# Patient Record
Sex: Male | Born: 1955 | Race: White | Hispanic: No | Marital: Married | Smoking: Never smoker
Health system: Southern US, Community
[De-identification: ages and names within clinical notes are randomized; demographics above are authoritative.]

## PROBLEM LIST (undated history)

## (undated) DIAGNOSIS — O2230 Deep phlebothrombosis in pregnancy, unspecified trimester: Secondary | ICD-10-CM

## (undated) DIAGNOSIS — I4891 Unspecified atrial fibrillation: Secondary | ICD-10-CM

## (undated) DIAGNOSIS — I2510 Atherosclerotic heart disease of native coronary artery without angina pectoris: Secondary | ICD-10-CM

## (undated) DIAGNOSIS — I10 Essential (primary) hypertension: Secondary | ICD-10-CM

## (undated) DIAGNOSIS — E079 Disorder of thyroid, unspecified: Secondary | ICD-10-CM

---

## 2017-02-02 ENCOUNTER — Emergency Department (HOSPITAL_BASED_OUTPATIENT_CLINIC_OR_DEPARTMENT_OTHER): Payer: BLUE CROSS/BLUE SHIELD

## 2017-02-02 ENCOUNTER — Emergency Department (HOSPITAL_BASED_OUTPATIENT_CLINIC_OR_DEPARTMENT_OTHER)
Admission: EM | Admit: 2017-02-02 | Discharge: 2017-02-02 | Disposition: A | Discharge status: Home / Self Care | Payer: BLUE CROSS/BLUE SHIELD | Attending: Emergency Medicine | Admitting: Emergency Medicine

## 2017-02-02 ENCOUNTER — Encounter (HOSPITAL_BASED_OUTPATIENT_CLINIC_OR_DEPARTMENT_OTHER): Payer: Self-pay | Admitting: Emergency Medicine

## 2017-02-02 DIAGNOSIS — Z79899 Other long term (current) drug therapy: Secondary | ICD-10-CM | POA: Diagnosis not present

## 2017-02-02 DIAGNOSIS — M25561 Pain in right knee: Secondary | ICD-10-CM

## 2017-02-02 DIAGNOSIS — M25462 Effusion, left knee: Secondary | ICD-10-CM | POA: Diagnosis not present

## 2017-02-02 DIAGNOSIS — M25562 Pain in left knee: Secondary | ICD-10-CM | POA: Insufficient documentation

## 2017-02-02 DIAGNOSIS — M1712 Unilateral primary osteoarthritis, left knee: Secondary | ICD-10-CM

## 2017-02-02 DIAGNOSIS — Z86718 Personal history of other venous thrombosis and embolism: Secondary | ICD-10-CM | POA: Insufficient documentation

## 2017-02-02 DIAGNOSIS — I2510 Atherosclerotic heart disease of native coronary artery without angina pectoris: Secondary | ICD-10-CM | POA: Insufficient documentation

## 2017-02-02 DIAGNOSIS — Z7982 Long term (current) use of aspirin: Secondary | ICD-10-CM | POA: Insufficient documentation

## 2017-02-02 DIAGNOSIS — I4891 Unspecified atrial fibrillation: Secondary | ICD-10-CM | POA: Insufficient documentation

## 2017-02-02 HISTORY — DX: Atherosclerotic heart disease of native coronary artery without angina pectoris: I25.10

## 2017-02-02 HISTORY — DX: Essential (primary) hypertension: I10

## 2017-02-02 HISTORY — DX: Deep phlebothrombosis in pregnancy, unspecified trimester: O22.30

## 2017-02-02 HISTORY — DX: Unspecified atrial fibrillation: I48.91

## 2017-02-02 HISTORY — DX: Disorder of thyroid, unspecified: E07.9

## 2017-02-02 NOTE — ED Triage Notes (Signed)
Patient states that he has had pain to his left knee x 1 - 2 months. The patient reports that he is concerned that he may have a DVT to his knee. Denies any pain to his left calf, denies any SOB

## 2017-02-02 NOTE — ED Notes (Signed)
Patient transported to Ultrasound 

## 2017-02-02 NOTE — ED Provider Notes (Signed)
[facility]   CSN: [id number] Arrival date & time: 02/02/17  1008     History   Chief Complaint Chief Complaint  Patient presents with  . Knee Pain    HPI Casey Khan is a 61 y.o. male who presents emergency Department with chief complaint of left knee pain. He has been ongoing for 1 month. He has associated swelling and pain daily. He denies any injuries that may have caused it. He has a past medical history of a left upper extremity DVT and is currently on Pradaxa. He is unsure of the cause of the DVT. Patient is here on a work related injury. He is here with a friend of his who is a nurse she wanted him to be evaluated for potential DVT secondary to fact he is complaining of calf pain. He denies any swelling. He states it feels different from his previous DVT. He denies chest pain or shortness of breath.  HPI  Past Medical History:  Diagnosis Date  . A-fib (HCC)   . Coronary artery disease   . DVT (deep vein thrombosis) in pregnancy (HCC)   . Hypertension   . Thyroid disease     There are no active problems to display for this patient.   History reviewed. No pertinent surgical history.     Home Medications    Prior to Admission medications   Medication Sig Start Date End Date Taking? Authorizing Provider  aspirin 81 MG chewable tablet Chew by mouth daily.   Yes [provider]  dabigatran (PRADAXA) 150 MG CAPS capsule Take 150 mg by mouth 2 (two) times daily.   Yes [provider]  furosemide (LASIX) 20 MG tablet Take 20 mg by mouth.   Yes [provider]  potassium chloride (K-DUR) 10 MEQ tablet Take 10 mEq by mouth daily.   Yes [provider]    Family History History reviewed. No pertinent family history.  Social History Social History  Substance Use Topics  . Smoking status: Never Smoker  . Smokeless tobacco: Never Used  . Alcohol use No     Allergies   Patient has no known  allergies.   Review of Systems Review of Systems Ten systems reviewed and are negative for acute change, except as noted in the HPI.    Physical Exam Updated Vital Signs BP (!) 147/82 (BP Location: Right Arm)   Pulse (!) 56   Temp 98.2 F (36.8 C) (Oral)   Resp 18   Ht 6' (1.829 m)   Wt 126.1 kg (278 lb)   SpO2 98%   BMI 37.70 kg/m   Physical Exam Physical Exam  Nursing note and vitals reviewed. Constitutional: He appears well-developed and well-nourished. No distress.  HENT:  Head: Normocephalic and atraumatic.  Eyes: Conjunctivae normal are normal. No scleral icterus.  Neck: Normal range of motion. Neck supple.  Cardiovascular: Normal rate, regular rhythm and normal heart sounds.   Pulmonary/Chest: Effort normal and breath sounds normal. No respiratory distress.  Abdominal: Soft. There is no tenderness.  Musculoskeletal: Right knee warm without erythema, mild effusion present. Pain with flexion and extension, stable joint without excessive give or ligamentous laxity. Calves are tender with soft compartments. Neurological: He is alert.  Skin: Skin is warm and dry. He is not diaphoretic.  Psychiatric: His behavior is normal.     ED Treatments / Results  Labs (all labs ordered are listed, but only abnormal results are displayed) Labs Reviewed - No data to  display  EKG  EKG Interpretation None       Radiology US Venous Img Lower  Left (dvt Study)  Result Date: 02/02/2017 CLINICAL DATA:  Medial left knee pain and swelling for the past month, worse for the past week. EXAM: LEFT LOWER EXTREMITY VENOUS DOPPLER ULTRASOUND TECHNIQUE: Gray-scale sonography with graded compression, as well as color Doppler and duplex ultrasound were performed to evaluate the lower extremity deep venous systems from the level of the common femoral vein and including the common femoral, femoral, profunda femoral, popliteal and calf veins including the posterior tibial, peroneal and  gastrocnemius veins when visible. The superficial great saphenous vein was also interrogated. Spectral Doppler was utilized to evaluate flow at rest and with distal augmentation maneuvers in the common femoral, femoral and popliteal veins. COMPARISON:  None. FINDINGS: Contralateral Common Femoral Vein: Respiratory phasicity is normal and symmetric with the symptomatic side. No evidence of thrombus. Normal compressibility. Common Femoral Vein: No evidence of thrombus. Normal compressibility, respiratory phasicity and response to augmentation. Saphenofemoral Junction: No evidence of thrombus. Normal compressibility and flow on color Doppler imaging. Profunda Femoral Vein: No evidence of thrombus. Normal compressibility and flow on color Doppler imaging. Femoral Vein: No evidence of thrombus. Normal compressibility, respiratory phasicity and response to augmentation. Popliteal Vein: No evidence of thrombus. Normal compressibility, respiratory phasicity and response to augmentation. Calf Veins: No evidence of thrombus. Normal compressibility and flow on color Doppler imaging. Superficial Great Saphenous Vein: No evidence of thrombus. Normal compressibility and flow on color Doppler imaging. Venous Reflux:  None. Other Findings:  None. IMPRESSION: No evidence of DVT within the left lower extremity. Electronically Signed   By: Beckie Salts M.D.   On: 02/02/2017 11:42   Dg Knee Complete 4 Views Left  Result Date: 02/02/2017 CLINICAL DATA:  Medial left knee pain and swelling for the past month, worse for the past week. EXAM: LEFT KNEE - COMPLETE 4+ VIEW COMPARISON:  None. FINDINGS: Mild medial, minimal lateral and mild to moderate patellofemoral spur formation. No fracture, dislocation or effusion. IMPRESSION: Mild tricompartmental degenerative changes.  No acute abnormality. Electronically Signed   By: Beckie Salts M.D.   On: 02/02/2017 11:43    Procedures Procedures (including critical care time)  Medications  Ordered in ED Medications - No data to display   Initial Impression / Assessment and Plan / ED Course  I have reviewed the triage vital signs and the nursing notes.  Pertinent labs & imaging results that were available during my care of the patient were reviewed by me and considered in my medical decision making (see chart for details).     Patient with negative ultrasound, x-ray negative for acute abnormality. I believe his osteoarthritis with effusion. Patient will advise to use a knee sleeve, follow up with orthopedics. He appears safe for discharge at this time.  Final Clinical Impressions(s) / ED Diagnoses   Final diagnoses:  Effusion of left knee  Acute pain of right knee  Primary osteoarthritis of left knee    New Prescriptions New Prescriptions   No medications on file     Arthor Captain, PA-C 02/02/17 1719    Arby Barrette, MD 02/07/17 [phone]

## 2017-02-02 NOTE — Discharge Instructions (Signed)
Contact a health care provider if: °Your skin turns red. °You develop a rash. °You have pain that gets worse. °You have a fever along with joint or muscle aches. °Get help right away if: °You lose a lot of weight. °You suddenly lose your appetite. °You have night sweats. °

## 2017-10-06 IMAGING — US US EXTREM LOW VENOUS*L*
1 series · 13 of 24 positions shown · non-contrast
Comparison: None.

CLINICAL DATA: Medial left knee pain and swelling for the past
month, worse for the past week.



[Series 1: us extrem low venous*left* · 0.09mm/px · 13 of 28 slices shown]
[im 1/28]
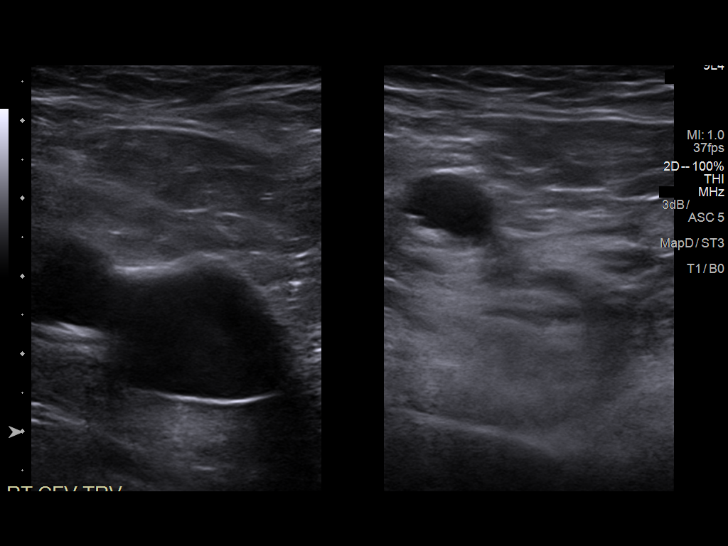
[im 3/28]
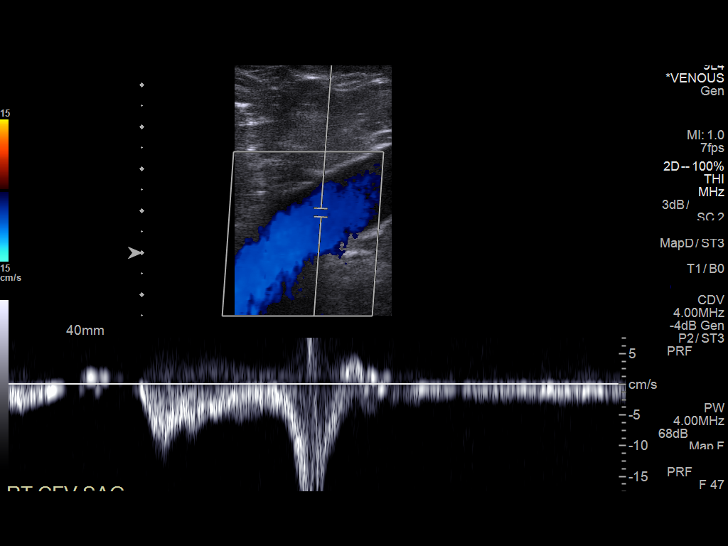
[im 5/28]
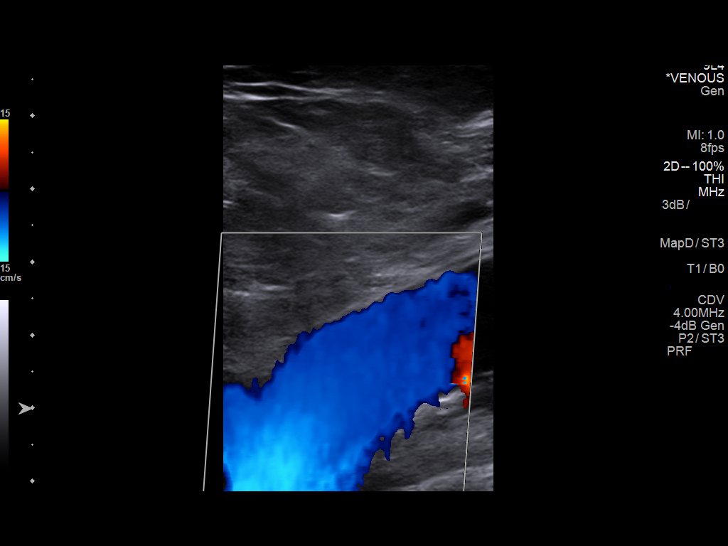
[im 8/28]
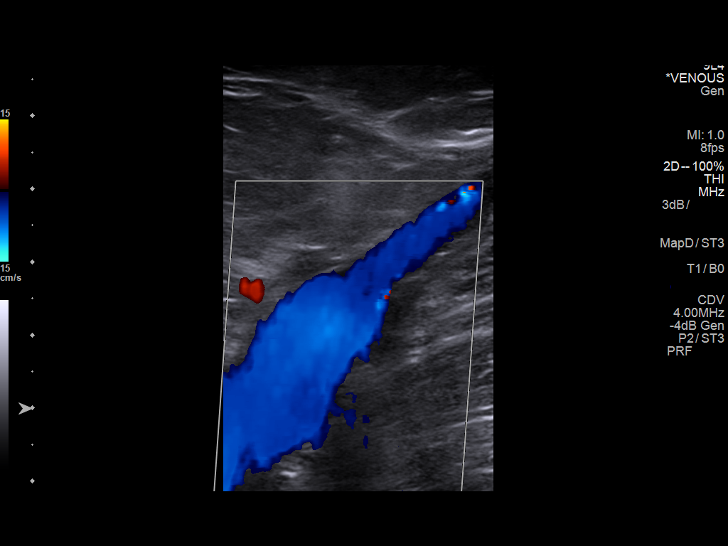
[im 10/28]
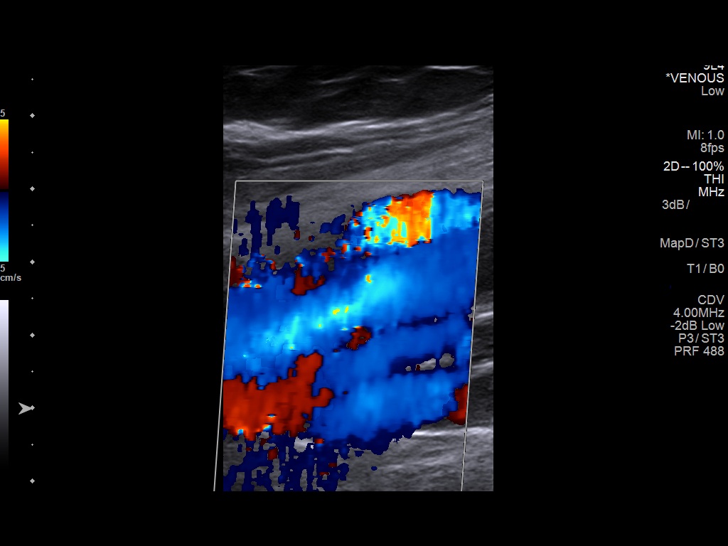
[im 12/28]
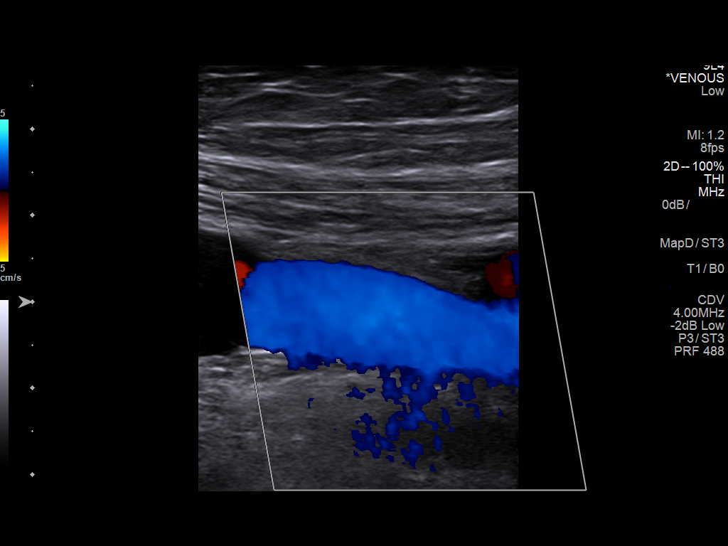
[im 15/28]
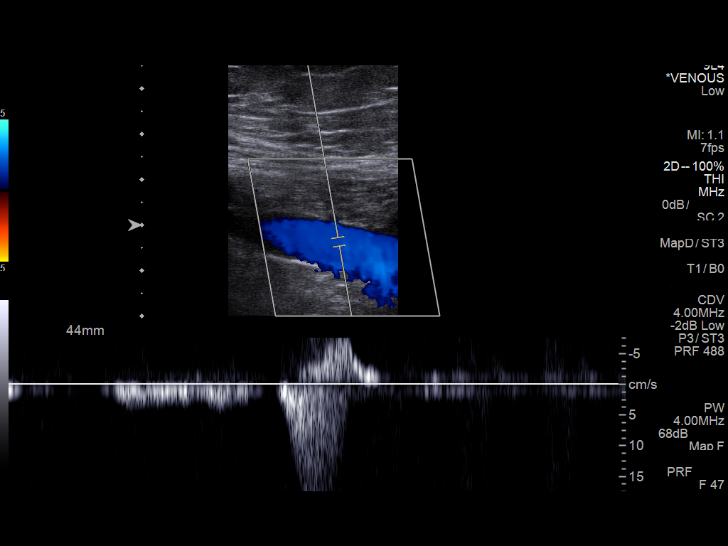
[im 16/28]
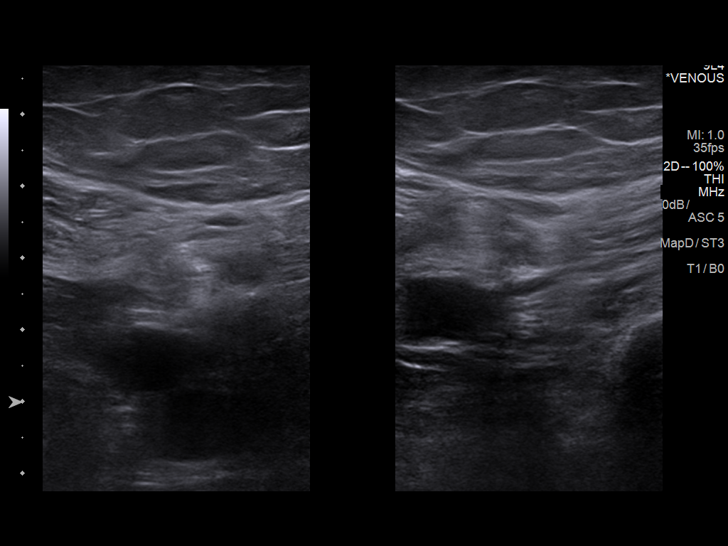
[im 18/28]
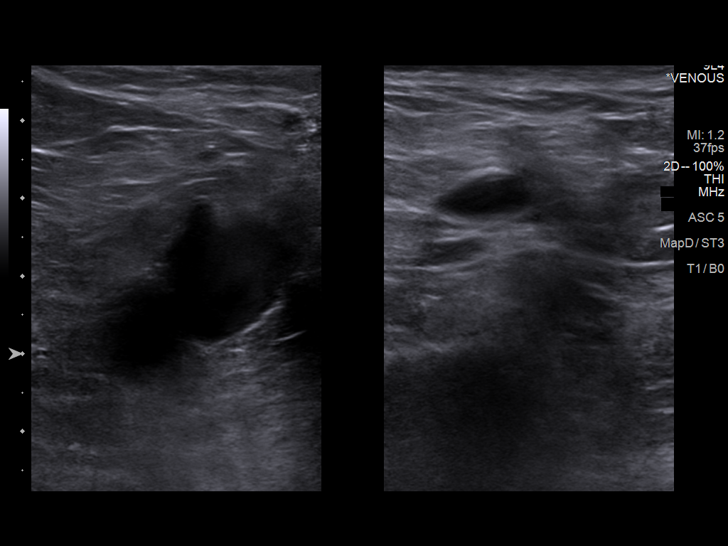
[im 20/28]
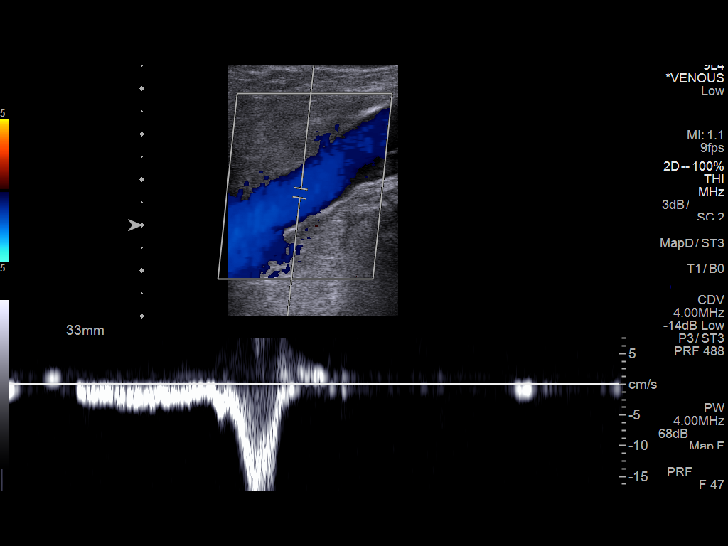
[im 23/28]
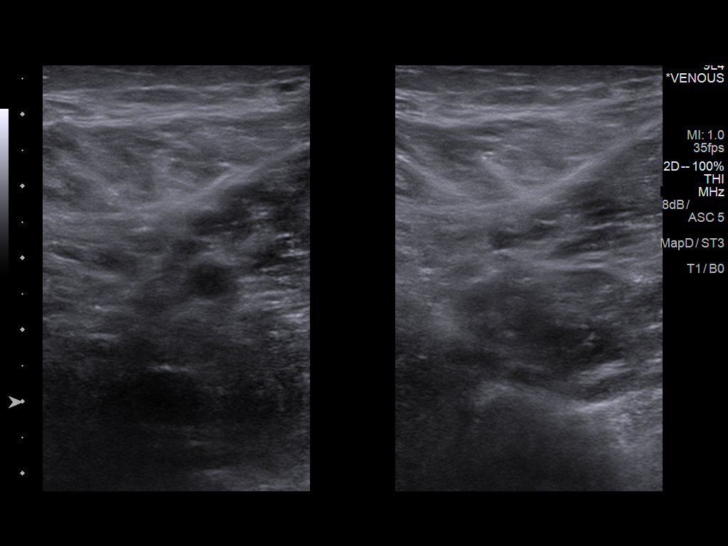
[im 25/28]
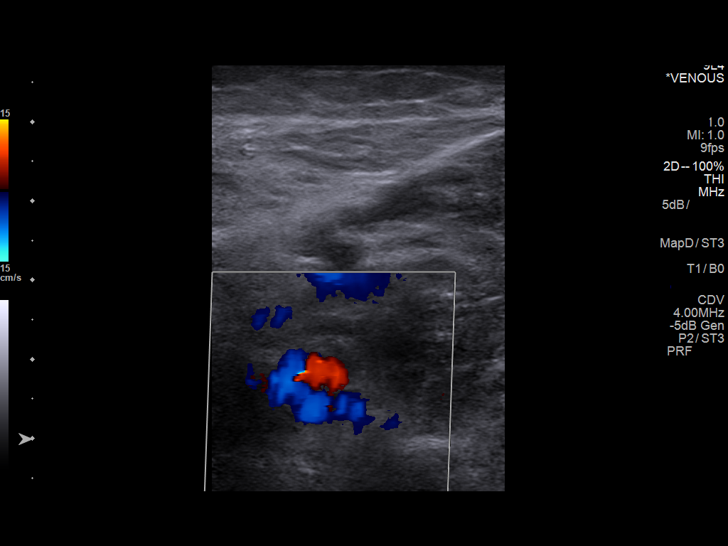
[im 28/28]
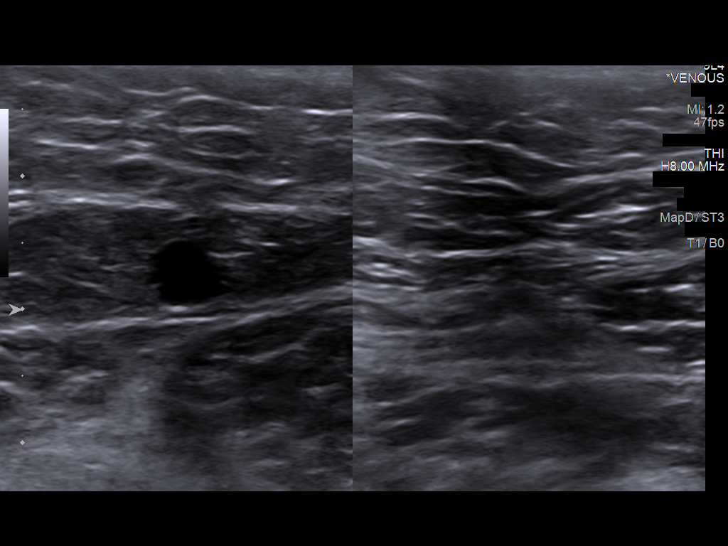

[13 of 24 positions shown; findings below may reference images not displayed]

FINDINGS: Contralateral Common Femoral Vein: Respiratory phasicity is normal
and symmetric with the symptomatic side. No evidence of thrombus.
Normal compressibility.

Common Femoral Vein: No evidence of thrombus. Normal
compressibility, respiratory phasicity and response to augmentation.

Saphenofemoral Junction: No evidence of thrombus. Normal
compressibility and flow on color Doppler imaging.

Profunda Femoral Vein: No evidence of thrombus. Normal
compressibility and flow on color Doppler imaging.

Femoral Vein: No evidence of thrombus. Normal compressibility,
respiratory phasicity and response to augmentation.

Popliteal Vein: No evidence of thrombus. Normal compressibility,
respiratory phasicity and response to augmentation.

Calf Veins: No evidence of thrombus. Normal compressibility and flow
on color Doppler imaging.

Superficial Great Saphenous Vein: No evidence of thrombus. Normal
compressibility and flow on color Doppler imaging.

Venous Reflux:  None.

Other Findings:  None.
IMPRESSION: No evidence of DVT within the left lower extremity.
